# Patient Record
Sex: Female | Born: 1955 | Race: White | Hispanic: No | Marital: Married | Smoking: Never smoker
Health system: Southern US, Community
[De-identification: ages and names within clinical notes are randomized; demographics above are authoritative.]

---

## 2013-12-16 ENCOUNTER — Emergency Department (HOSPITAL_COMMUNITY)
Admission: EM | Admit: 2013-12-16 | Discharge: 2013-12-16 | Disposition: A | Discharge status: Home / Self Care | Payer: Self-pay | Attending: Emergency Medicine | Admitting: Emergency Medicine

## 2013-12-16 ENCOUNTER — Encounter (HOSPITAL_COMMUNITY): Payer: Self-pay | Admitting: Emergency Medicine

## 2013-12-16 DIAGNOSIS — K0889 Other specified disorders of teeth and supporting structures: Secondary | ICD-10-CM

## 2013-12-16 DIAGNOSIS — K089 Disorder of teeth and supporting structures, unspecified: Secondary | ICD-10-CM | POA: Insufficient documentation

## 2013-12-16 DIAGNOSIS — H9209 Otalgia, unspecified ear: Secondary | ICD-10-CM | POA: Insufficient documentation

## 2013-12-16 DIAGNOSIS — R110 Nausea: Secondary | ICD-10-CM | POA: Insufficient documentation

## 2013-12-16 DIAGNOSIS — R61 Generalized hyperhidrosis: Secondary | ICD-10-CM | POA: Insufficient documentation

## 2013-12-16 MED ORDER — PENICILLIN V POTASSIUM 500 MG PO TABS: 500.0000 mg | ORAL_TABLET | Freq: Four times a day (QID) | ORAL | Status: AC

## 2013-12-16 MED ORDER — HYDROCODONE-ACETAMINOPHEN 5-325 MG PO TABS
1.0000 | ORAL_TABLET | Freq: Once | ORAL | Status: AC
  Administered 2013-12-16: 1 via ORAL
  Filled 2013-12-16: qty 1

## 2013-12-16 MED ORDER — HYDROCODONE-ACETAMINOPHEN 5-325 MG PO TABS: 1.0000 | ORAL_TABLET | ORAL | Status: AC | PRN

## 2013-12-16 NOTE — Discharge Instructions (Signed)
Read the information below.  Use the prescribed medication as directed.  Please discuss all new medications with your pharmacist.  Do not take additional tylenol while taking the prescribed pain medication to avoid overdose.  You may return to the Emergency Department at any time for worsening condition or any new symptoms that concern you.  Please call the dentist listed above within 48 hours to schedule a close follow up appointment.  If you develop fevers, swelling in your face, difficulty swallowing or breathing, return to the ER immediately for a recheck.     Dental Pain A tooth ache may be caused by cavities (tooth decay). Cavities expose the nerve of the tooth to air and hot or cold temperatures. It may come from an infection or abscess (also called a boil or furuncle) around your tooth. It is also often caused by dental caries (tooth decay). This causes the pain you are having. DIAGNOSIS  Your caregiver can diagnose this problem by exam. TREATMENT   If caused by an infection, it may be treated with medications which kill germs (antibiotics) and pain medications as prescribed by your caregiver. Take medications as directed.  Only take over-the-counter or prescription medicines for pain, discomfort, or fever as directed by your caregiver.  Whether the tooth ache today is caused by infection or dental disease, you should see your dentist as soon as possible for further care. SEEK MEDICAL CARE IF: The exam and treatment you received today has been provided on an emergency basis only. This is not a substitute for complete medical or dental care. If your problem worsens or new problems (symptoms) appear, and you are unable to meet with your dentist, call or return to this location. SEEK IMMEDIATE MEDICAL CARE IF:   You have a fever.  You develop redness and swelling of your face, jaw, or neck.  You are unable to open your mouth.  You have severe pain uncontrolled by pain medicine. MAKE  SURE YOU:   Understand these instructions.  Will watch your condition.  Will get help right away if you are not doing well or get worse. Document Released: 06/29/2005 Document Revised: 09/21/2011 Document Reviewed: 02/15/2008 [facility] Patient Information 2014 [place], [place].   Emergency Department Resource Guide 1) Find a Doctor and Pay Out of Pocket Although you won't have to find out who is covered by your insurance plan, it is a good idea to ask around and get recommendations. You will then need to call the office and see if the doctor you have chosen will accept you as a new patient and what types of options they offer for patients who are self-pay. Some doctors offer discounts or will set up payment plans for their patients who do not have insurance, but you will need to ask so you aren't surprised when you get to your appointment.  2) Contact Your Local Health Department Not all health departments have doctors that can see patients for sick visits, but many do, so it is worth a call to see if yours does. If you don't know where your local health department is, you can check in your phone book. The CDC also has a tool to help you locate your state's health department, and many state websites also have listings of all of their local health departments.  3) Find a Walk-in Clinic If your illness is not likely to be very severe or complicated, you may want to try a walk in clinic. These are popping up all over the  country in pharmacies, drugstores, and shopping centers. They're usually staffed by nurse practitioners or physician assistants that have been trained to treat common illnesses and complaints. They're usually fairly quick and inexpensive. However, if you have serious medical issues or chronic medical problems, these are probably not your best option.  No Primary Care Doctor: - Call Health Connect at  [phone] - they can help you locate a primary care doctor that  accepts your  insurance, provides certain services, etc. - Physician Referral Service- [phone]  Chronic Pain Problems: Organization         Address  Phone   Notes  [facility]  [phone] Patients need to be referred by their primary care doctor.   Medication Assistance: Organization         Address  Phone   Notes  [facility] Medication [facility] [address] [place], [place] [address] [phone](336) [phone] --Must be a resident of [facility] -- Must have NO insurance coverage whatsoever (no Medicaid/ Medicare, etc.) -- The pt. MUST have a primary care doctor that directs their care regularly and follows them in the community   MedAssist  [phone]   [employer]  [phone]    Agencies that provide inexpensive medical care: Organization         Address  Phone   Notes  [facility]  [phone]) [phone]   [facility]    [phone](336) [phone]   [facility] [address] [place], [place] [address] [phone]) [phone]   [facility] [place] [address], [place] [phone]) [phone]   Planned Parenthood    [phone]   [facility]    [phone](336) [phone]   [facility] and [facility]  [address], [place] Phone:  [phone], Fax:  [phone] Hours of Operation:  9 am - 6 pm, M-F.  Also accepts Medicaid/Medicare and self-pay.  [facility]  [address], [place] Phone: [phone], Fax: [phone]. Hours of Operation:  8:30 am - 5:30 pm, M-F.  Also accepts Medicaid and self-pay.  [facility] [place] [address], [place] Phone: [phone](336) [phone]   [facility] [address], [place], [place] [phone], Ext. [phone] Mondays & Thursdays: 7-9 AM.  First 15 patients are seen on a first come, first serve basis.    Medicaid-accepting [facility] [place] Providers:  Organization          Address  Phone   Notes  [facility] [address], [place] [phone] Also accepts self-pay patients.  [facility] [address], [place]  [phone]) [phone]   [facility] [address], [place] [phone]) [phone]   [facility] [address] [place] [address], [place] [phone]) [phone]   Renaye RakersVeita Bland [address], [place]   [phone] Only accepts [place] Access [place] patients after they have their name applied to their card.   Self-Pay (no insurance) in [facility] [place]:  Organization         Address  Phone   Notes  Sickle Cell Patients, [facility] [address], [place] [phone]   [facility] [address], [place] [phone]) [phone]   [facility] [place]  [address],  [address], [place] [phone]   [facility]/Dr. Osei-Bonsu  [address] [place] [address], [place] or [address], [place] [phone] Phone number for both [place] and [place] locations is the same.  [facility] [address], [place] [phone]) [phone]   [facility] [place] [address] [place] [address], [place] or [address] [phone]) [phone](336) [phone]   [facility] [address], [place] [phone]) [phone], phone; [phone]) [phone], fax Sees patients 1st and 3rd Saturday of every month.  Must not qualify for public or private insurance (i.e. Medicaid, Medicare, [place] Health Choice, Veterans' Benefits)  Household income should be no more than 200% of the poverty level The clinic cannot treat you if you are pregnant or think you are pregnant  Sexually transmitted diseases are not treated at the clinic.    Dental Care: Organization         Address  Phone  Notes  [facility] [address],  [place] [phone]) [phone] Accepts children up to age 58 who are enrolled in [place]icaid or [place] Health Choice; pregnant women with a Medicaid card; and children who have applied for Medicaid or [place] Health Choice, but were declined, whose parents can pay a reduced fee at time of service.  [facility] Department of [facility] [place]  [address], [place] [phone](336) [phone] Accepts children up to age 58 who are enrolled in [place]icaid or  Health Choice; pregnant women with a Medicaid card; and children who have applied for Medicaid or  Health Choice, but were declined, whose parents can pay a reduced fee at time of service.  [facility]  [address], [place] [phone]) [phone] Patients are seen by appointment only. Walk-ins are not accepted. [facility] will see patients 58 years of age and older. Monday - Tuesday (8am-5pm) Most Wednesdays (8:30-5pm) $30 per visit, cash only  [facility]  [address], [facility] [phone] Patients are seen by appointment only. Walk-ins are not accepted. [facility] will see patients 58 years of age and older. One Wednesday Evening (Monthly: Volunteer Based).  $30 per visit, cash only  [employer] [facility] [facility]  [phone]) [phone] for adults; Children under age 114, call Graduate Pediatric Dentistry at [phone]. Children aged 824-14, please call [phone]) [phone] to request a pediatric application.  Dental services are provided in all areas of dental care including fillings, crowns and bridges, complete and partial dentures, implants, gum treatment, root canals, and extractions. Preventive care is also provided. Treatment is provided to both adults and children. Patients are selected via a lottery and there is often a waiting list.   [facility] [address], [place]  [phone]) [phone] [email or web address]   [facility] [address], [place], [place]  [phone], Ext. [phone] Second and Fourth Thursday of each month, opens at 6:30 AM; Clinic ends at 9 AM.  Patients are seen on a first-come first-served basis, and a limited number are seen during each clinic.   [facility]  [address], [place], [place] [phone](336) [phone]   Eligibility Requirements You must have lived in [place], [place], or [place] counties for at least the last three months.   You cannot be eligible for state or federal sponsored National Cityhealthcare insurance, including CIGNAVeterans Administration, [place]icaid, or [employer] EntertainmentMedicare.   You  generally cannot be eligible for healthcare insurance through your employer.    How to apply: Eligibility screenings are held every Tuesday and Wednesday afternoon from 1:00 pm until 4:00 pm. You do not need an appointment for the interview!  [facility] [address], [place], [place] [phone]   [facility]  [phone]   [facility]  [phone]   [facility]  [phone]    Behavioral Health Resources in the Community: Intensive Outpatient Programs Organization         Address  Phone  Notes  [facility] [address], [place], [place] [phone]   [facility] [address], [place], [place] [phone]   [facility] [address], [place], [place]  [phone]   [facility] [address],  [place], [place] [phone] or [phone]   Substance Abuse Resources Organization         Address  Phone  Notes  [facility] Drug Services  [phone]   [facility]  [phone]   [facility]  [phone]   [facility]  [phone]   Residential & Outpatient Substance Abuse Program  [phone]   Psychological Services Organization         Address  Phone  Notes  [facility]  [phone]- [phone]   [facility]  [phone]- [phone]    [facility] [address], [place] [phone] or [phone]    Mobile Crisis Teams Organization         Address  Phone  Notes  [facility], Mobile Crisis Care Unit  [phone]   [facility]  [address] [place], [place] [phone]   Doristine LocksSharon DeEsch [address] [place] [phone]    Self-Help/Support Groups Organization         Address  Phone             Notes  Mental Health Assoc. of [place] - variety of support groups  336- [phone] Call for more information  Narcotics Anonymous (NA), Caring Services [address], [place]  2 meetings at this location   Statisticianesidential Treatment Programs Organization         Address  Phone  Notes  [facility] [address],    [place]  [phone]   [facility]  [address], [place] [phone], [place], [place] [phone]   [facility] [address], [place] [phone] Admissions: 8am-3pm M-F  [facility] [address]    [place], [place] [phone]   [facility] [address], [place], [place] [phone]   [facility] [address]  [place], [place] [phone]   [facility] - Intensive Outpatient [address], [place], [place] [phone]   [facility] (Addiction Recovery [facility] [address]  [place], [place] [phone] or [phone]   [facility] (RTS) [address] [place], [place] [phone] Accepts Medicaid  [facility] [address]  [place] [phone] Substance Abuse/Addiction Treatment   [facility] Behavioral Health Resources Organization         Address  Phone  Notes  [facility]  [phone]) [phone]   Angie FavaJulie Brannon, PhD [address], [address] [place], [place]   [phone](336) [phone] or [phone](336) [phone]   [facility]   [phone]  [address] [place], [place] [phone](336) [phone]   [facility] [address], [place], [place] [phone]) [phone] Insurance/Medicaid/sponsorship through [facility]  Faith and Families [address]                                    [place], [place] [phone]) [phone] Therapy/tele-psych/case  [facility] [address]   [place], [place] [phone](336) [phone]    Dr. Lolly MustacheArfeen  [phone](336) [phone]   [facility]  [employer] [facility] 1) [address], [place] 2) [address], [place] 3)  [address], [place] [phone]) [phone]  [phone](336) [phone]   [facility] Child Abuse Hotline [phone](336) [phone] or [phone]) [phone] (After Hours)

## 2013-12-16 NOTE — ED Notes (Addendum)
shes had lower R gum pain since Thursday. She tried otc topical pain meds with no relief

## 2013-12-16 NOTE — ED Notes (Signed)
PT declined W/C at D/C the patient escorted to lobby by RN.

## 2013-12-16 NOTE — Progress Notes (Signed)
ED CM received  Incoming call from [facility] pharmacy concerning patient being unable to afford discharge proscription for Pen VK at cost of $80.00.  Spoke with patient informed patient medication is on the [employer] Antibiotic list with VIC card.Pt is agreeable states, she will take prescription to [facility]. Pt instructed contact me if she has any further questions or concerns. Pt verbalized understanding and appreciative of assistance. No further CM needs identified.

## 2013-12-16 NOTE — ED Provider Notes (Signed)
CSN: [id number]     Arrival date & time 12/16/13  1013 History  This chart was scribed for Trixie Dredge PA-C working with Suzi Roots, MD by Ashley Jacobs, ED scribe. This patient was seen in room TR07C/TR07C and the patient's care was started at 11:02 AM.  First MD Initiated Contact with Patient 12/16/13 1057     Chief Complaint  Patient presents with  . Dental Pain     (Consider location/radiation/quality/duration/timing/severity/associated sxs/prior Treatment) Patient is a 58 y.o. female presenting with tooth pain. The history is provided by the patient and medical records. No language interpreter was used.  Dental Pain Associated symptoms: no fever    HPI Comments: Desiree Vega is a 58 y.o. female who presents to the Emergency Department complaining of constant,, severe, right- sided, throbbing, gum pain with swelling for the past three days. She wears partials and think they are not fitting properly.  Pt has receeding gums. She reports having ear pain, diaphoresis, and nausea. She has mild throat soreness. She is having trouble eating due to pain. Pt has tried taking several OTC medications with temporary improvement of symptoms. Denies chest pain and SOB. Denies trouble swallowing. Denies sore throat.   She does not have a current dentist.   History reviewed. No pertinent past medical history. History reviewed. No pertinent past surgical history. History reviewed. No pertinent family history. History  Substance Use Topics  . Smoking status: Never Smoker   . Smokeless tobacco: Not on file  . Alcohol Use: No   OB History   Grav Para Term Preterm Abortions TAB SAB Ect Mult Living                 Review of Systems  Constitutional: Positive for diaphoresis. Negative for fever.  HENT: Positive for dental problem and ear pain. Negative for trouble swallowing.   Respiratory: Negative for shortness of breath.   Cardiovascular: Negative for chest pain.  Gastrointestinal: Positive  for nausea.  All other systems reviewed and are negative.     Allergies  Review of patient's allergies indicates no known allergies.  Home Medications   Prior to Admission medications   Medication Sig Start Date End Date Taking? Authorizing Provider  acetaminophen (TYLENOL) 650 MG CR tablet Take 650 mg by mouth every 8 (eight) hours as needed for pain.   Yes Historical Provider, MD  OVER THE COUNTER MEDICATION Take 2 tablets by mouth every 6 (six) hours as needed (headache). Over the counter tension headache tablets acetaminophen and caffeine   Yes Historical Provider, MD   BP 157/80  Pulse 97  Temp(Src) 98.4 F (36.9 C) (Oral)  Resp 16  Ht 5' (1.524 m)  Wt 139 lb 12.8 oz (63.413 kg)  BMI 27.30 kg/m2  SpO2 100% Physical Exam  Nursing note and vitals reviewed. Constitutional: She appears well-developed and well-nourished. No distress.  HENT:  Head: Normocephalic and atraumatic.  Mouth/Throat: Uvula is midline and oropharynx is clear and moist. Mucous membranes are not dry. No oropharyngeal exudate, posterior oropharyngeal edema, posterior oropharyngeal erythema or tonsillar abscesses.     Pt's right lower bicuspid and right lower cuspid have remote fracture to them.     Neck: Normal range of motion. Neck supple.  Pulmonary/Chest: Effort normal.  Lymphadenopathy:    She has no cervical adenopathy (anterior ).  Neurological: She is alert.  Skin: She is not diaphoretic.    ED Course  Procedures (including critical care time) DIAGNOSTIC STUDIES: Oxygen Saturation is 100% on room  air, normal by my interpretation.    COORDINATION OF CARE:  11:12 AM Discussed course of care with pt which includes referral to dentist. Pt understands and agrees.    Labs Review Labs Reviewed - No data to display  Imaging Review No results found.   EKG Interpretation None      MDM   Final diagnoses:  Pain, dental   Afebrile, nontoxic patient with new dental pain.  No obvious  abscess.  No concerning findings on exam.  Doubt deep space head or neck infection.  Doubt Ludwig's angina.  D/C home with antibiotic, pain medication and dental follow up.  Discussed findings, treatment, and follow up  with patient.  Pt given return precautions.  Pt verbalizes understanding and agrees with plan.       I personally performed the services described in this documentation, which was scribed in my presence. The recorded information has been reviewed and is accurate.    Trixie Dredgemily Kaelem Brach, PA-C 12/16/13 1232

## 2013-12-16 NOTE — ED Provider Notes (Signed)
Medical screening examination/treatment/procedure(s) were performed by non-physician practitioner and as supervising physician I was immediately available for consultation/collaboration.     Marylan Glore E Jarry Manon, MD 12/16/13 1640 

## 2020-03-19 ENCOUNTER — Other Ambulatory Visit: Payer: Self-pay

## 2020-03-19 ENCOUNTER — Telehealth: Payer: Self-pay

## 2020-03-19 DIAGNOSIS — Z1231 Encounter for screening mammogram for malignant neoplasm of breast: Secondary | ICD-10-CM

## 2020-03-19 NOTE — Addendum Note (Signed)
Addended by: Lucilla Lame E on: 03/19/2020 10:25 AM   Modules accepted: Orders

## 2020-03-19 NOTE — Telephone Encounter (Signed)
Left message for patient about mammogram scholarship application. Left name and number for patient to call back. 

## 2020-04-05 ENCOUNTER — Other Ambulatory Visit: Payer: Self-pay

## 2020-04-05 ENCOUNTER — Ambulatory Visit
Admission: RE | Admit: 2020-04-05 | Discharge: 2020-04-05 | Disposition: A | Discharge status: Home / Self Care | Payer: No Typology Code available for payment source | Source: Ambulatory Visit | Attending: Adult Health | Admitting: Adult Health

## 2020-04-05 DIAGNOSIS — Z1231 Encounter for screening mammogram for malignant neoplasm of breast: Secondary | ICD-10-CM

## 2022-05-19 IMAGING — MG DIGITAL SCREENING BILAT W/ TOMO W/ CAD
8 series · 9 of 24 positions shown · non-contrast
Comparison: Previous exam(s).

CLINICAL DATA: Screening.

EXAM:
DIGITAL SCREENING BILATERAL MAMMOGRAM WITH TOMO AND CAD

[R MLO synth-2D]
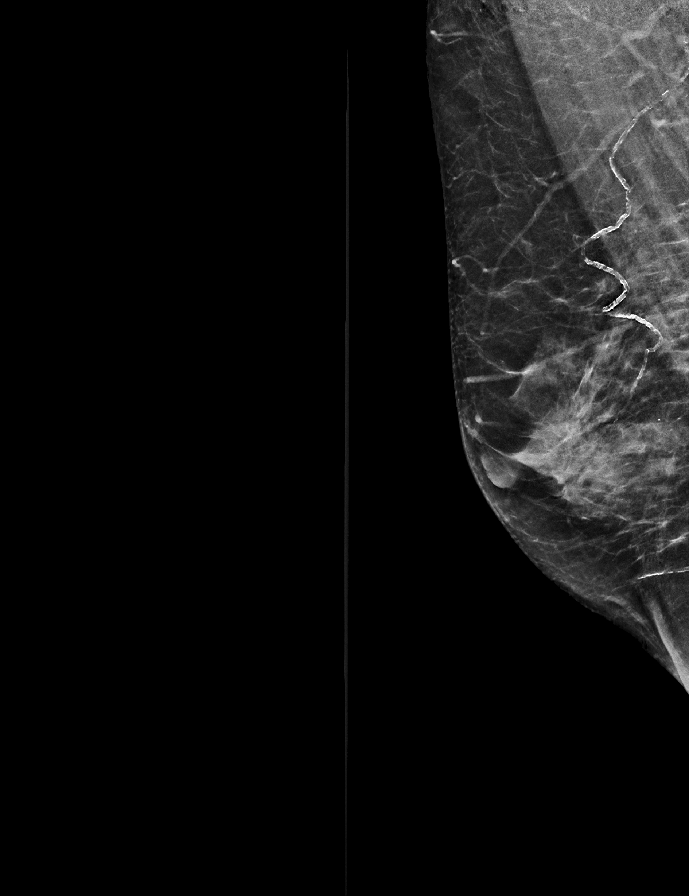

[L MLO synth-2D]
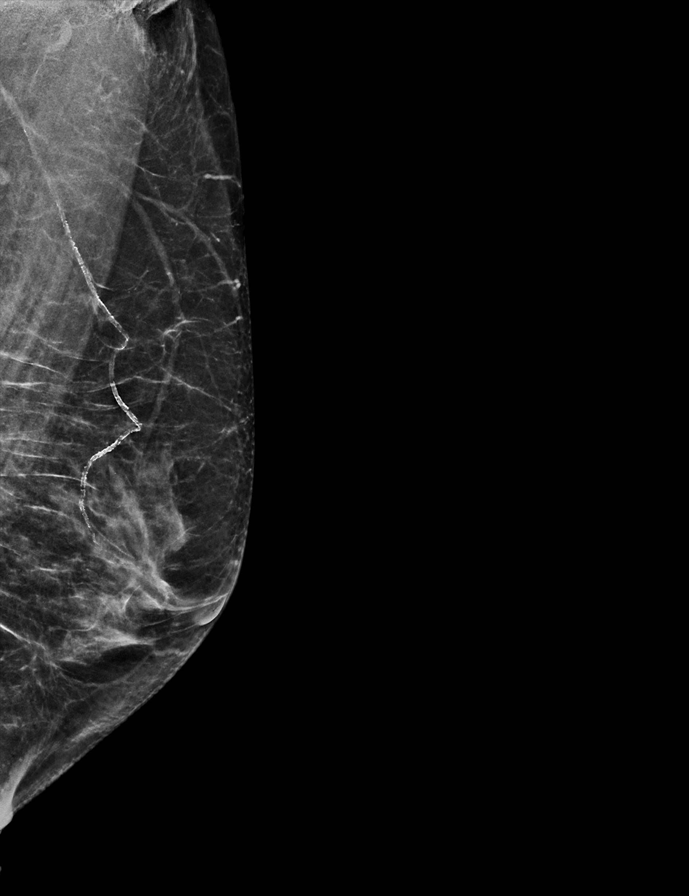

[R CC synth-2D]
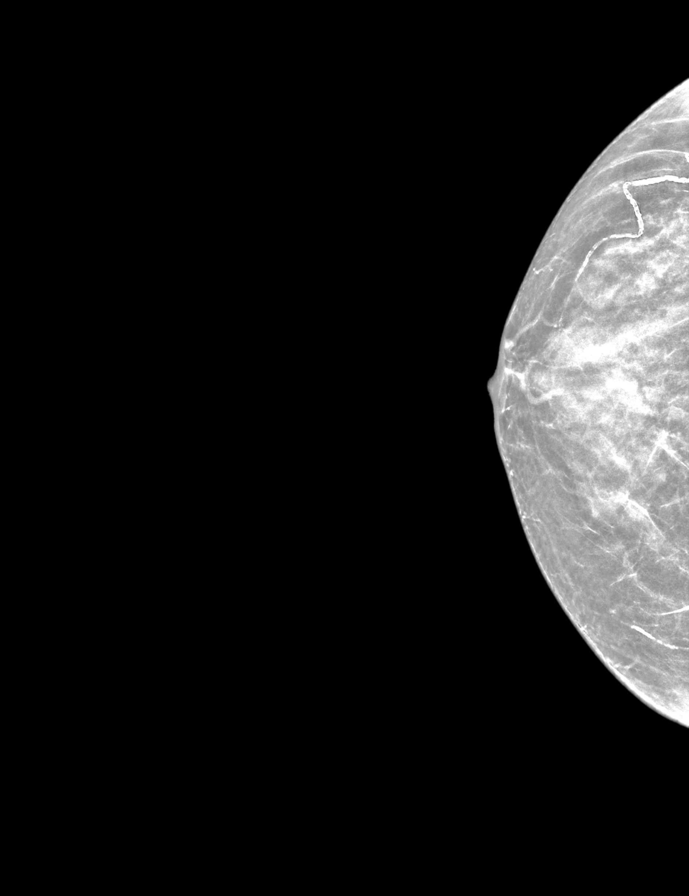

[L CC synth-2D]
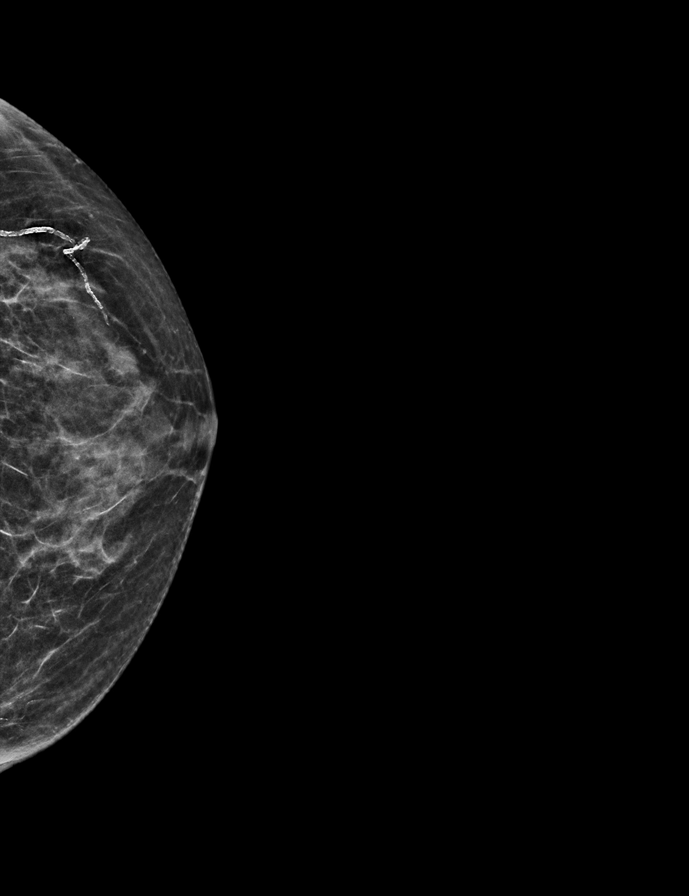

[L MLO tomo · 2 of 50 frames shown]
[frame 17/50]
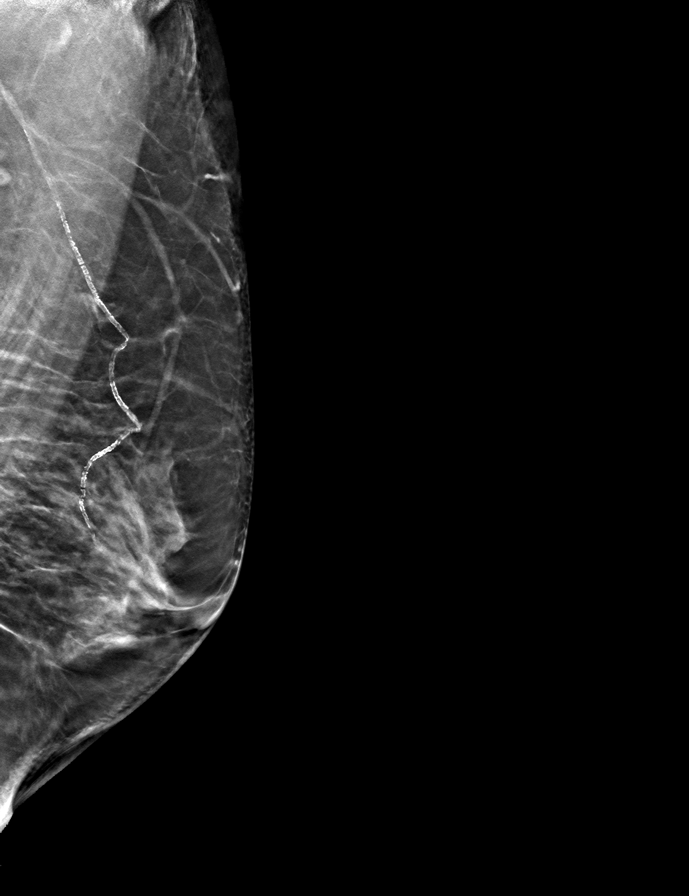
[frame 25/50]
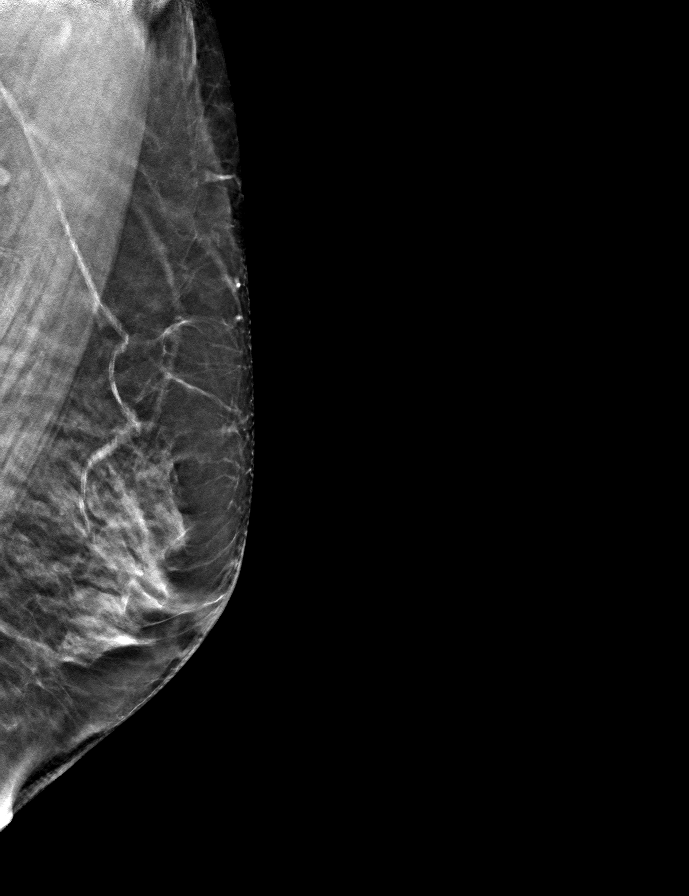

[R MLO tomo · tomo slice 25/50.0]
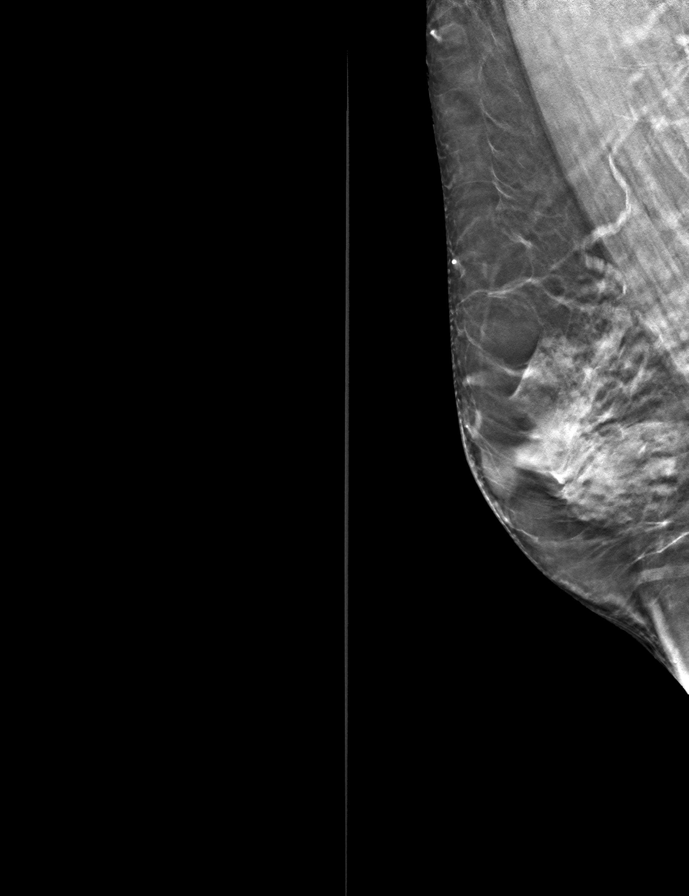

[L CC tomo · tomo slice 23/44.0]
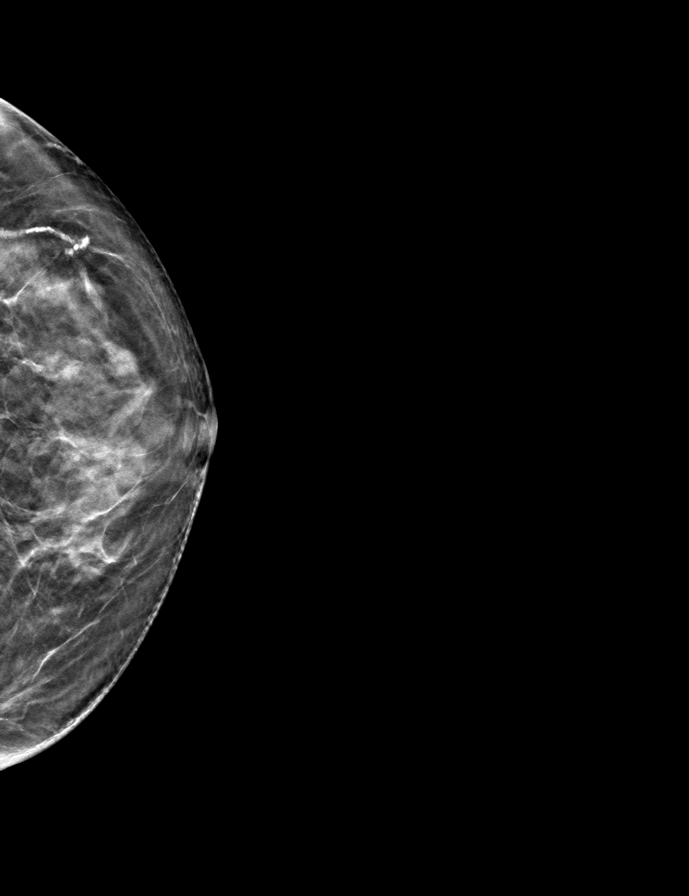

[R CC tomo · tomo slice 21/42.0]
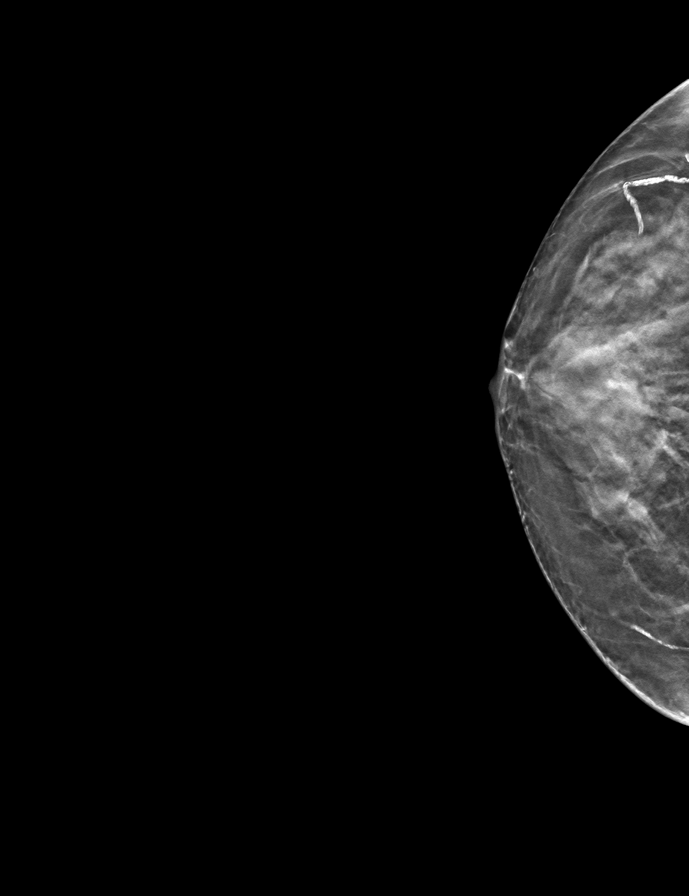

[9 of 24 positions shown; findings below may reference images not displayed]

ACR Breast Density Category c: The breast tissue is heterogeneously
dense, which may obscure small masses.
FINDINGS: There are no findings suspicious for malignancy. Images were
processed with CAD.
IMPRESSION: No mammographic evidence of malignancy. A result letter of this
screening mammogram will be mailed directly to the patient.

RECOMMENDATION:
Screening mammogram in one year. (Code:[id number])

BI-RADS CATEGORY  1: Negative.
